# Patient Record
Sex: Male | Born: 2000 | Race: White | Hispanic: No | Marital: Single
Health system: Southern US, Community
[De-identification: ages and names within clinical notes are randomized; demographics above are authoritative.]

## PROBLEM LIST (undated history)

## (undated) DIAGNOSIS — R51 Headache: Secondary | ICD-10-CM

## (undated) DIAGNOSIS — J45909 Unspecified asthma, uncomplicated: Secondary | ICD-10-CM

## (undated) DIAGNOSIS — R519 Headache, unspecified: Secondary | ICD-10-CM

## (undated) HISTORY — DX: Headache, unspecified: R51.9

## (undated) HISTORY — DX: Headache: R51

---
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
## 2024-06-06 ENCOUNTER — Emergency Department (HOSPITAL_COMMUNITY): Payer: Self-pay

## 2024-06-06 ENCOUNTER — Other Ambulatory Visit: Payer: Self-pay

## 2024-06-06 ENCOUNTER — Emergency Department (HOSPITAL_COMMUNITY)
Admission: EM | Admit: 2024-06-06 | Discharge: 2024-06-06 | Disposition: A | Discharge status: Home / Self Care | Payer: Self-pay | Attending: Emergency Medicine | Admitting: Emergency Medicine

## 2024-06-06 ENCOUNTER — Encounter (HOSPITAL_COMMUNITY): Payer: Self-pay

## 2024-06-06 DIAGNOSIS — S93402A Sprain of unspecified ligament of left ankle, initial encounter: Secondary | ICD-10-CM | POA: Insufficient documentation

## 2024-06-06 DIAGNOSIS — M7989 Other specified soft tissue disorders: Secondary | ICD-10-CM | POA: Insufficient documentation

## 2024-06-06 DIAGNOSIS — X501XXA Overexertion from prolonged static or awkward postures, initial encounter: Secondary | ICD-10-CM | POA: Insufficient documentation

## 2024-06-06 DIAGNOSIS — S93602A Unspecified sprain of left foot, initial encounter: Secondary | ICD-10-CM | POA: Insufficient documentation

## 2024-06-06 MED ORDER — OXYCODONE-ACETAMINOPHEN 5-325 MG PO TABS: 1.0000 | ORAL_TABLET | Freq: Four times a day (QID) | ORAL | 0 refills | Status: AC | PRN

## 2024-06-06 NOTE — ED Triage Notes (Signed)
 Pov from home. Cc of left ankle pain after rolling ankle last night. Did not take anything for pain Hurts to put weight on it

## 2024-06-06 NOTE — ED Provider Notes (Signed)
 [place] EMERGENCY DEPARTMENT AT [facility] Provider Note   CSN: [id number] Arrival date & time: 06/06/24  9396     Patient presents with: Ankle Pain   Jeremy [name] is a 23 y.o. male.    Ankle Pain  Presents with left foot and ankle pain.  Last night he rolled his ankle.  Complaining of foot in the ankle and foot.  No other injury.    Prior to Admission medications   Medication Sig Start Date End Date Taking? Authorizing Provider  oxyCODONE-acetaminophen (PERCOCET/ROXICET) 5-325 MG tablet Take 1 tablet by mouth every 6 (six) hours as needed for severe pain (pain score 7-10). 06/06/24  Yes Patsey Lot, MD  fluticasone  (FLONASE ) 50 MCG/ACT nasal spray Place 1 spray into both nostrils daily.    [provider]    Allergies: Patient has no known allergies.    Review of Systems  Updated Vital Signs BP 128/86   Pulse 89   Temp 98 F (36.7 C)   Resp 18   Ht 5' 11 (1.803 m)   Wt 99.8 kg   SpO2 96%   BMI 30.68 kg/m   Physical Exam Vitals and nursing note reviewed.  Musculoskeletal:     Comments: Tenderness over much of ankle but particularly on the lateral aspect.  Also tenderness over the lateral aspect of the foot.  No deformity.  Skin intact.  Neurological:     Mental Status: He is alert.     (all labs ordered are listed, but only abnormal results are displayed) Labs Reviewed - No data to display  EKG: None  Radiology: DG Foot Complete Left Result Date: 06/06/2024 CLINICAL DATA:  Left foot injury last night.  Lateral foot pain. EXAM: LEFT FOOT - COMPLETE 3+ VIEW COMPARISON:  None Available. FINDINGS: There is no evidence of fracture or dislocation. There is no evidence of arthropathy or other focal bone abnormality. Soft tissues are unremarkable. IMPRESSION: Negative. Electronically Signed   By: Norleen DELENA Kil M.D.   On: 06/06/2024 07:38   DG Ankle Complete Left Result Date: 06/06/2024 CLINICAL DATA:  Rolled ankle last night.   Ankle pain and swelling. EXAM: LEFT ANKLE COMPLETE - 3+ VIEW COMPARISON:  None Available. FINDINGS: There is no evidence of fracture, dislocation, or joint effusion. There is no evidence of arthropathy or other focal bone abnormality. Soft tissues are unremarkable. IMPRESSION: Negative. Electronically Signed   By: Norleen DELENA Kil M.D.   On: 06/06/2024 07:38     Procedures   Medications Ordered in the ED - No data to display                                  Medical Decision Making Amount and/or Complexity of Data Reviewed Radiology: ordered.  Risk Prescription drug management.   Patient was inversion injury of the left foot.  Pain of the foot and ankle.  Differential diagnose includes sprains and fractures.  Will get x-rays of both the ankle and the foot to evaluate.  X-rays of the foot and ankle both independently interpreted and reviewed radiology read and no fracture.  Will give cam walker to help with symptoms.  Also some pain medicines.  Follow-up with Ortho if symptoms not improved.  Likely sprain.     Final diagnoses:  Sprain of left ankle, unspecified ligament, initial encounter  Sprain of left foot, initial encounter    ED Discharge Orders  Ordered    oxyCODONE-acetaminophen (PERCOCET/ROXICET) 5-325 MG tablet  Every 6 hours PRN        06/06/24 0751               Patsey Lot, MD 06/06/24 [phone]
